# Patient Record
Sex: Female | Born: 1995 | Race: White | Hispanic: No | Marital: Single | Smoking: Never smoker
Health system: Southern US, Community
[De-identification: ages and names within clinical notes are randomized; demographics above are authoritative.]

## PROBLEM LIST (undated history)

## (undated) DIAGNOSIS — Z789 Other specified health status: Secondary | ICD-10-CM

## (undated) HISTORY — PX: NO PAST SURGERIES: SHX2092

---

## 2020-07-28 ENCOUNTER — Emergency Department (HOSPITAL_COMMUNITY)
Admission: EM | Admit: 2020-07-28 | Discharge: 2020-07-28 | Disposition: A | Discharge status: Home / Self Care | Payer: Self-pay | Attending: Emergency Medicine | Admitting: Emergency Medicine

## 2020-07-28 ENCOUNTER — Encounter (HOSPITAL_COMMUNITY): Payer: Self-pay | Admitting: *Deleted

## 2020-07-28 ENCOUNTER — Emergency Department (HOSPITAL_COMMUNITY): Payer: Self-pay

## 2020-07-28 DIAGNOSIS — R110 Nausea: Secondary | ICD-10-CM | POA: Insufficient documentation

## 2020-07-28 DIAGNOSIS — R1114 Bilious vomiting: Secondary | ICD-10-CM | POA: Insufficient documentation

## 2020-07-28 DIAGNOSIS — Z28310 Unvaccinated for covid-19: Secondary | ICD-10-CM | POA: Insufficient documentation

## 2020-07-28 DIAGNOSIS — R1084 Generalized abdominal pain: Secondary | ICD-10-CM | POA: Insufficient documentation

## 2020-07-28 LAB — COMPREHENSIVE METABOLIC PANEL
ALT: 9 U/L (ref 0–44)
AST: 10 U/L — ABNORMAL LOW (ref 15–41)
Albumin: 4.2 g/dL (ref 3.5–5.0)
Alkaline Phosphatase: 48 U/L (ref 38–126)
Anion gap: 5 (ref 5–15)
BUN: 9 mg/dL (ref 6–20)
CO2: 28 mmol/L (ref 22–32)
Calcium: 9.1 mg/dL (ref 8.9–10.3)
Chloride: 103 mmol/L (ref 98–111)
Creatinine, Ser: 0.86 mg/dL (ref 0.44–1.00)
GFR, Estimated: >60 mL/min (ref >60)
Glucose, Bld: 107 mg/dL — ABNORMAL HIGH (ref 70–99)
Potassium: 3.9 mmol/L (ref 3.5–5.1)
Sodium: 136 mmol/L (ref 135–145)
Total Bilirubin: 0.6 mg/dL (ref 0.3–1.2)
Total Protein: 7.5 g/dL (ref 6.5–8.1)

## 2020-07-28 LAB — URINALYSIS, ROUTINE W REFLEX MICROSCOPIC
Bilirubin Urine: NEGATIVE
Glucose, UA: NEGATIVE mg/dL
Ketones, ur: 20 mg/dL — AB
Leukocytes,Ua: NEGATIVE
Nitrite: NEGATIVE
Protein, ur: NEGATIVE mg/dL
Specific Gravity, Urine: 1.01 (ref 1.005–1.030)
pH: 7 (ref 5.0–8.0)

## 2020-07-28 LAB — CBC
HCT: 39.9 % (ref 36.0–46.0)
Hemoglobin: 12.8 g/dL (ref 12.0–15.0)
MCH: 27.8 pg (ref 26.0–34.0)
MCHC: 32.1 g/dL (ref 30.0–36.0)
MCV: 86.6 fL (ref 80.0–100.0)
Platelets: 364 10*3/uL (ref 150–400)
RBC: 4.61 MIL/uL (ref 3.87–5.11)
RDW: 12.7 % (ref 11.5–15.5)
WBC: 11.1 10*3/uL — ABNORMAL HIGH (ref 4.0–10.5)
nRBC: 0 % (ref 0.0–0.2)

## 2020-07-28 LAB — LIPASE, BLOOD: Lipase: 27 U/L (ref 11–51)

## 2020-07-28 LAB — I-STAT BETA HCG BLOOD, ED (MC, WL, AP ONLY): I-stat hCG, quantitative: <5 m[IU]/mL (ref <5)

## 2020-07-28 MED ORDER — MORPHINE SULFATE (PF) 4 MG/ML IV SOLN
4.0000 mg | Freq: Once | INTRAVENOUS | Status: AC
Start: 2020-07-28 — End: 2020-07-28
  Administered 2020-07-28: 4 mg via INTRAVENOUS
  Filled 2020-07-28: qty 1

## 2020-07-28 MED ORDER — METOCLOPRAMIDE HCL 5 MG/ML IJ SOLN
10.0000 mg | Freq: Once | INTRAMUSCULAR | Status: AC
  Administered 2020-07-28: 10 mg via INTRAVENOUS
  Filled 2020-07-28: qty 2

## 2020-07-28 MED ORDER — ONDANSETRON HCL 4 MG/2ML IJ SOLN
4.0000 mg | Freq: Once | INTRAMUSCULAR | Status: AC
  Administered 2020-07-28: 4 mg via INTRAVENOUS
  Filled 2020-07-28: qty 2

## 2020-07-28 MED ORDER — OMEPRAZOLE 20 MG PO CPDR: 20.0000 mg | DELAYED_RELEASE_CAPSULE | Freq: Every day | ORAL | 0 refills | Status: DC

## 2020-07-28 MED ORDER — SODIUM CHLORIDE 0.9 % IV BOLUS
1000.0000 mL | Freq: Once | INTRAVENOUS | Status: AC
  Administered 2020-07-28: 1000 mL via INTRAVENOUS

## 2020-07-28 MED ORDER — SUCRALFATE 1 G PO TABS: 1.0000 g | ORAL_TABLET | Freq: Three times a day (TID) | ORAL | 0 refills | Status: DC

## 2020-07-28 NOTE — ED Provider Notes (Signed)
[facility]-EMERGENCY DEPT Provider Note   CSN: [id number] Arrival date & time: 07/28/20  1101     History Chief Complaint  Patient presents with  . Abdominal Pain    Jenny Gross is a 25 y.o. female.  HPI Patient presents with her mother who assists with the history. She presents with 6 days of nausea,, p.o. intolerance and abdominal pain.  No known sick contacts.  She has not received her COVID vaccines.  No clear precipitant, and since onset she has had diffuse abdominal pain with out any ability for take medication for relief.  No fever, no diarrhea. No history of abdominal surgery.  Last menstrual period 8 days ago.  She went to another hospital yesterday, was prescribed Zofran, had evaluation not including CT or ultrasound, and was discharged.  Patient has not yet obtained or used her medication.    History reviewed. No pertinent past medical history.  There are no problems to display for this patient.   History reviewed. No pertinent surgical history.   OB History   No obstetric history on file.     No family history on file.     Home Medications Prior to Admission medications   Not on File    Allergies    Patient has no allergy information on record.  Review of Systems   Review of Systems  Constitutional:       Per HPI, otherwise negative  HENT:       Per HPI, otherwise negative  Respiratory:       Per HPI, otherwise negative  Cardiovascular:       Per HPI, otherwise negative  Gastrointestinal: Positive for abdominal pain, nausea and vomiting.  Endocrine:       Negative aside from HPI  Genitourinary:       Neg aside from HPI   Musculoskeletal:       Per HPI, otherwise negative  Skin: Negative.   Neurological: Negative for syncope.    Physical Exam Updated Vital Signs BP 109/72 (BP Location: Left Arm)   Pulse (!) 59   Temp 98.4 F (36.9 C) (Oral)   Resp 16   LMP 07/20/2020   SpO2 100%   Physical  Exam Vitals and nursing note reviewed.  Constitutional:      General: She is not in acute distress.    Appearance: She is well-developed.  HENT:     Head: Normocephalic and atraumatic.  Eyes:     Conjunctiva/sclera: Conjunctivae normal.  Cardiovascular:     Rate and Rhythm: Normal rate and regular rhythm.  Pulmonary:     Effort: Pulmonary effort is normal. No respiratory distress.     Breath sounds: Normal breath sounds. No stridor.  Abdominal:     General: There is no distension.     Tenderness: There is generalized abdominal tenderness. There is guarding.  Skin:    General: Skin is warm and dry.  Neurological:     Mental Status: She is alert and oriented to person, place, and time.     Cranial Nerves: No cranial nerve deficit.     ED Results / Procedures / Treatments   Labs (all labs ordered are listed, but only abnormal results are displayed) Labs Reviewed  COMPREHENSIVE METABOLIC PANEL - Abnormal; Notable for the following components:      Result Value   Glucose, Bld 107 (*)    AST 10 (*)    All other components within normal limits  CBC - Abnormal; Notable  for the following components:   WBC 11.1 (*)    All other components within normal limits  URINALYSIS, ROUTINE W REFLEX MICROSCOPIC - Abnormal; Notable for the following components:   Hgb urine dipstick MODERATE (*)    Ketones, ur 20 (*)    Bacteria, UA RARE (*)    All other components within normal limits  SARS CORONAVIRUS 2 (TAT 6-24 HRS)  LIPASE, BLOOD  I-STAT BETA HCG BLOOD, ED ([facility], [facility], [facility] ONLY)    Radiology CT Abdomen Pelvis Wo Contrast  Result Date: 07/28/2020 CLINICAL DATA:  Abdominal pain and vomiting for 6 days. Mid to lower abdominal pain EXAM: CT ABDOMEN AND PELVIS WITHOUT CONTRAST TECHNIQUE: Multidetector CT imaging of the abdomen and pelvis was performed following the standard protocol without IV contrast. COMPARISON:  None. FINDINGS: Lower chest: Included lung bases are clear.  Heart size is  normal. Hepatobiliary: Unremarkable unenhanced appearance of the liver. No focal liver lesion identified. Gallbladder within normal limits. No hyperdense gallstone. No biliary dilatation. Pancreas: Unremarkable. No pancreatic ductal dilatation or surrounding inflammatory changes. Spleen: Normal in size without focal abnormality. Adrenals/Urinary Tract: Adrenal glands are unremarkable. Kidneys are normal, without renal calculi, focal lesion, or hydronephrosis. Bladder is unremarkable. Stomach/Bowel: Stomach is within normal limits. Appendix appears normal (series 4, image 57). No evidence of bowel wall thickening, distention, or inflammatory changes. Vascular/Lymphatic: No significant vascular findings are evident on noncontrast study. No enlarged abdominal or pelvic lymph nodes. Reproductive: Uterus and adnexal regions are within normal limits for age. Other: Small volume free fluid within the cul-de-sac. No organized abdominopelvic fluid collection. No pneumoperitoneum. No abdominal wall hernia. Musculoskeletal: Suspect sacralization of the L5 segment. No acute osseous abnormality. No suspicious bone lesions. No soft tissue edema or fluid collections. IMPRESSION: 1. No acute abdominopelvic findings.  Normal appendix. 2. Small volume free fluid within the cul-de-sac, which may be physiologic. Electronically Signed   By: Duanne Guess D.O.   On: 07/28/2020 14:56   US Abdomen Limited  Result Date: 07/28/2020 CLINICAL DATA:  Nausea, vomiting, abdominal pain EXAM: ULTRASOUND ABDOMEN LIMITED RIGHT UPPER QUADRANT COMPARISON:  CT abdomen and pelvis 07/28/2020 FINDINGS: Gallbladder: Normally distended without stones or wall thickening. No pericholecystic fluid or sonographic Murphy sign. Common bile duct: Diameter: 5 mm, normal Liver: Normal echogenicity without mass or nodularity. Portal vein is patent on color Doppler imaging with normal direction of blood flow towards the liver. Other: No RIGHT upper quadrant free  fluid. IMPRESSION: Normal exam. Electronically Signed   By: Ulyses Southward M.D.   On: 07/28/2020 16:57    Procedures Procedures   Medications Ordered in ED Medications  sodium chloride 0.9 % bolus 1,000 mL (0 mLs Intravenous Stopped 07/28/20 1536)  ondansetron (ZOFRAN) injection 4 mg (4 mg Intravenous Given 07/28/20 1405)  morphine 4 MG/ML injection 4 mg (4 mg Intravenous Given 07/28/20 1405)  sodium chloride 0.9 % bolus 1,000 mL (0 mLs Intravenous Stopped 07/28/20 1633)  metoCLOPramide (REGLAN) injection 10 mg (10 mg Intravenous Given 07/28/20 1545)    ED Course  I have reviewed the triage vital signs and the nursing notes.  Pertinent labs & imaging results that were available during my care of the patient were reviewed by me and considered in my medical decision making (see chart for details).   On repeat exam after fluids provided the patient continues to complain of pain.  CT scan reviewed, no evidence for acute abdominal processes. With additional consideration of hepatobiliary dysfunction, ultrasound ordered.   5:13 PM Patient  in no distress, sitting upright.  She has tolerated small amounts of liquids. She and I discussed findings ultrasound, CT which I reviewed.  Also confirmed her absence of concern for STD, she denies marijuana use, drug use. With labs that are unremarkable aside from trivial leukocytosis, reassuring ultrasound, CT scan, vital signs, patient is appropriate for ongoing monitoring, management as an outpatient.  Patient amenable to this.  Patient will start empiric therapy for gastroesophageal etiology, follow-up with our GI colleagues.  COVID test pending on discharge. MDM Rules/Calculators/A&P MDM Number of Diagnoses or Management Options Bilious vomiting with nausea: established, worsening Generalized abdominal pain: established, worsening   Amount and/or Complexity of Data Reviewed Clinical lab tests: ordered and reviewed Tests in the radiology section of  CPT: reviewed and ordered Tests in the medicine section of CPT: reviewed and ordered Decide to obtain previous medical records or to obtain history from someone other than the patient: yes Review and summarize past medical records: yes Independent visualization of images, tracings, or specimens: yes  Risk of Complications, Morbidity, and/or Mortality Presenting problems: high Diagnostic procedures: high Management options: high  Critical Care Total time providing critical care: < 30 minutes  Patient Progress Patient progress: stable  Final Clinical Impression(s) / ED Diagnoses Final diagnoses:  Generalized abdominal pain  Bilious vomiting with nausea    Rx / DC Orders ED Discharge Orders         Ordered    omeprazole (PRILOSEC) 20 MG capsule  Daily        07/28/20 1716    sucralfate (CARAFATE) 1 g tablet  3 times daily        07/28/20 1716           Gerhard Munch, MD 07/28/20 1717

## 2020-07-28 NOTE — ED Triage Notes (Signed)
Pt complains of abdominal pain and vomiting x 6 days. No diarrhea.

## 2020-07-28 NOTE — Discharge Instructions (Signed)
As discussed, your evaluation today has been largely reassuring.  But, it is important that you monitor your condition carefully, and do not hesitate to return to the ED if you develop new, or concerning changes in your condition. ? ?Otherwise, please follow-up with your physician for appropriate ongoing care. ? ?

## 2020-07-28 NOTE — ED Provider Notes (Signed)
Emergency Medicine Provider Triage Evaluation Note  Jenny Gross , a 25 y.o. female  was evaluated in triage.  Pt complains of Abdominal pain and vomiting for 6 days.  Reports middle and lower abdominal pain.  Went to [facility] last night but did not feel better with medications.  Reports persistent vomiting.  No changes to bowel movements.  Review of Systems  Positive: Abdominal pain, vomiting Negative: Diarrhea or constipation  Physical Exam  BP 114/70 (BP Location: Left Arm)   Pulse 78   Temp 98.4 F (36.9 C) (Oral)   Resp 16   LMP 07/20/2020   SpO2 99%  Gen:   Awake, no distress   Resp:  Normal effort  MSK:   Moves extremities without difficulty  Other:  Generalized tenderness  Medical Decision Making  Medically screening exam initiated at 12:52 PM.  Appropriate orders placed.  [facility] Sc[facility]lin was informed that the remainder of the evaluation will be completed by another provider, this initial triage assessment does not replace that evaluation, and the importance of remaining in the ED until their evaluation is complete.     Dietrich Pates, PA-C 07/28/20 1253    Gerhard Munch, MD 07/28/20 1346

## 2021-08-02 ENCOUNTER — Inpatient Hospital Stay (HOSPITAL_COMMUNITY)
Admission: AD | Admit: 2021-08-02 | Discharge: 2021-08-02 | Disposition: A | Discharge status: Home / Self Care | Payer: Medicaid Other | Attending: Obstetrics and Gynecology | Admitting: Obstetrics and Gynecology

## 2021-08-02 ENCOUNTER — Encounter (HOSPITAL_COMMUNITY): Payer: Self-pay | Admitting: Obstetrics and Gynecology

## 2021-08-02 ENCOUNTER — Other Ambulatory Visit: Payer: Self-pay

## 2021-08-02 DIAGNOSIS — R109 Unspecified abdominal pain: Secondary | ICD-10-CM

## 2021-08-02 DIAGNOSIS — R519 Headache, unspecified: Secondary | ICD-10-CM | POA: Diagnosis not present

## 2021-08-02 DIAGNOSIS — O26892 Other specified pregnancy related conditions, second trimester: Secondary | ICD-10-CM

## 2021-08-02 DIAGNOSIS — Z3A23 23 weeks gestation of pregnancy: Secondary | ICD-10-CM

## 2021-08-02 HISTORY — DX: Other specified health status: Z78.9

## 2021-08-02 LAB — URINALYSIS, ROUTINE W REFLEX MICROSCOPIC
Bilirubin Urine: NEGATIVE
Glucose, UA: NEGATIVE mg/dL
Hgb urine dipstick: NEGATIVE
Ketones, ur: NEGATIVE mg/dL
Nitrite: NEGATIVE
Protein, ur: NEGATIVE mg/dL
Specific Gravity, Urine: 1.004 — ABNORMAL LOW (ref 1.005–1.030)
pH: 7 (ref 5.0–8.0)

## 2021-08-02 LAB — COMPREHENSIVE METABOLIC PANEL
ALT: 20 U/L (ref 0–44)
AST: 14 U/L — ABNORMAL LOW (ref 15–41)
Albumin: 3 g/dL — ABNORMAL LOW (ref 3.5–5.0)
Alkaline Phosphatase: 54 U/L (ref 38–126)
Anion gap: 10 (ref 5–15)
BUN: <5 mg/dL — ABNORMAL LOW (ref 6–20)
CO2: 24 mmol/L (ref 22–32)
Calcium: 8.8 mg/dL — ABNORMAL LOW (ref 8.9–10.3)
Chloride: 103 mmol/L (ref 98–111)
Creatinine, Ser: 0.49 mg/dL (ref 0.44–1.00)
GFR, Estimated: >60 mL/min (ref >60)
Glucose, Bld: 90 mg/dL (ref 70–99)
Potassium: 3.2 mmol/L — ABNORMAL LOW (ref 3.5–5.1)
Sodium: 137 mmol/L (ref 135–145)
Total Bilirubin: 0.5 mg/dL (ref 0.3–1.2)
Total Protein: 6 g/dL — ABNORMAL LOW (ref 6.5–8.1)

## 2021-08-02 LAB — CBC
HCT: 31 % — ABNORMAL LOW (ref 36.0–46.0)
Hemoglobin: 10.2 g/dL — ABNORMAL LOW (ref 12.0–15.0)
MCH: 29 pg (ref 26.0–34.0)
MCHC: 32.9 g/dL (ref 30.0–36.0)
MCV: 88.1 fL (ref 80.0–100.0)
Platelets: 262 10*3/uL (ref 150–400)
RBC: 3.52 MIL/uL — ABNORMAL LOW (ref 3.87–5.11)
RDW: 14 % (ref 11.5–15.5)
WBC: 12 10*3/uL — ABNORMAL HIGH (ref 4.0–10.5)
nRBC: 0 % (ref 0.0–0.2)

## 2021-08-02 MED ORDER — ACETAMINOPHEN-CAFFEINE 500-65 MG PO TABS
1.0000 | ORAL_TABLET | Freq: Once | ORAL | Status: AC
  Administered 2021-08-02: 1 via ORAL
  Filled 2021-08-02: qty 1

## 2021-08-02 MED ORDER — CYCLOBENZAPRINE HCL 5 MG PO TABS
10.0000 mg | ORAL_TABLET | Freq: Once | ORAL | Status: AC
  Administered 2021-08-02: 10 mg via ORAL
  Filled 2021-08-02: qty 2

## 2021-08-02 NOTE — Discharge Instructions (Signed)
Prenatal Care Providers           [facility]  [address] [phone]  [facility]   [address]  [phone]  [facility] @ [place]       [address] [phone]            [facility] @ [place]     [address] [phone]          [facility] @ [place]   [address] [phone]  [facility] @ [facility]  [address] [phone]     [facility] @ [facility])  [address]   [phone]      
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
## 2022-09-09 ENCOUNTER — Ambulatory Visit (INDEPENDENT_AMBULATORY_CARE_PROVIDER_SITE_OTHER): Payer: Medicaid Other | Admitting: Internal Medicine

## 2022-09-09 ENCOUNTER — Encounter: Payer: Self-pay | Admitting: Internal Medicine

## 2022-09-09 VITALS — BP 104/62 | HR 70 | Resp 16 | Ht 67.0 in | Wt 182.6 lb

## 2022-09-09 DIAGNOSIS — H1045 Other chronic allergic conjunctivitis: Secondary | ICD-10-CM | POA: Diagnosis not present

## 2022-09-09 DIAGNOSIS — L500 Allergic urticaria: Secondary | ICD-10-CM | POA: Diagnosis not present

## 2022-09-09 DIAGNOSIS — J3089 Other allergic rhinitis: Secondary | ICD-10-CM

## 2022-09-09 DIAGNOSIS — J302 Other seasonal allergic rhinitis: Secondary | ICD-10-CM | POA: Diagnosis not present

## 2022-09-09 MED ORDER — AZELASTINE HCL 0.1 % NA SOLN: 2.0000 | Freq: Two times a day (BID) | NASAL | 12 refills | Status: AC

## 2022-09-09 MED ORDER — OLOPATADINE HCL 0.2 % OP SOLN: 1.0000 [drp] | Freq: Every day | OPHTHALMIC | 5 refills | Status: AC | PRN

## 2022-09-09 MED ORDER — EPINEPHRINE 0.3 MG/0.3ML IJ SOAJ: 0.3000 mg | INTRAMUSCULAR | 1 refills | Status: AC | PRN

## 2022-09-09 MED ORDER — CETIRIZINE HCL 10 MG PO TABS: 10.0000 mg | ORAL_TABLET | Freq: Every day | ORAL | 5 refills | Status: AC

## 2022-09-09 MED ORDER — FLUTICASONE PROPIONATE 50 MCG/ACT NA SUSP: 2.0000 | Freq: Every day | NASAL | 2 refills | Status: AC

## 2022-09-09 NOTE — Progress Notes (Signed)
New Patient Note  RE: Jenny Gross MRN: [id number] DOB: December 18, 1995 Date of Office Visit: 09/09/2022  Consult requested by: No ref. provider found Primary care provider: Pcp, No  Chief Complaint: Allergies and Food Intolerance  History of Present Illness: I had the pleasure of seeing Andree Moro for initial evaluation at the [facility] on 09/09/2022. She is a 27 y.o. female, who is referred here by Pcp, No for the evaluation of rhinitis .  History obtained from patient, chart review.  Chronic rhinitis: started as a young child  Symptoms include: nasal congestion, rhinorrhea, post nasal drainage, sneezing, watery eyes, itchy eyes, and itchy nose  Occurs year-round Potential triggers: dogs, cats. Dust, chemicals, strong odors  Treatments tried: zyrtec (not benefit), unknown nose sprays with some benefits Previous allergy testing: no History of reflux/heartburn:  only during pregnancy  History of chronic sinusitis or sinus surgery: no    Assessment and Plan: [name] is a 27 y.o. female with: Seasonal and perennial allergic rhinitis  Other chronic allergic conjunctivitis of both eyes  Allergic urticaria   Plan: Patient Instructions  Chronic Rhinitis Seasonal and Perennial Allergic: - allergy testing today: positive to grass, weed, cat, dog, horse, dust mite   - Prevention:  - allergen avoidance when possible Start allergy injections. Had a detailed discussion with patient/family that clinical history is suggestive of allergic rhinitis, and may benefit from allergy immunotherapy (AIT). Discussed in detail regarding the dosing, schedule, side effects (mild to moderate local allergic reaction and rarely systemic allergic reactions including anaphylaxis/death), alternatives and benefits (significant improvement in nasal symptoms, seasonal flares of asthma) of immunotherapy with the patient. There is significant time commitment involved with allergy  shots, which includes weekly immunotherapy injections for first 9-12 months and then biweekly to monthly injections for 3-5 years. Clinical response is often delayed and patient may not see an improvement for 6-12 months. Consent was signed. I have prescribed epinephrine injectable and demonstrated proper use. For mild symptoms you can take over the counter antihistamines such as Benadryl and monitor symptoms closely. If symptoms worsen or if you have severe symptoms including breathing issues, throat closure, significant swelling, whole body hives, severe diarrhea and vomiting, lightheadedness then inject epinephrine and seek immediate medical care afterwards. Action plan given.   - Symptom control: - Start Nasal Steroid Spray: Best results if used daily. - Options include Flonase (fluticasone), Nasocort (triamcinolone), Nasonex (mometasome) 1- 2 sprays in each nostril daily.  - All can be purchased over-the-counter if not covered by insurance. - Start Astelin (azelastine) 1-2 sprays in each nostril twice a day as needed for nasal congestion/itchy nose - Start Singulair (Montelukast) 10mg  nightly.   - Discontinue if nightmares of behavior changes. - Start Antihistamine: daily or daily as needed.   -Options include Zyrtec (Cetirizine) 10mg , Claritin (Loratadine) 10mg , Allegra (Fexofenadine) 180mg , or Xyzal (Levocetirinze) 5mg  - Can be purchased over-the-counter if not covered by insurance.  Allergic Conjunctivitis:  - Start Allergy Eye drops-great options include Pataday (Olopatadine) or Zaditor (ketotifen) for eye symptoms daily as needed-both sold over the counter if not covered by insurance. and Rewetting Drops such as Systane,TheraTears, etc  -Avoid eye drops that say red eye relief as they may contain medications that dry out your eyes.  Follow up: in 4 weeks for initial AIT injection  Follow up: in clinic in 6 months   Thank you so much for letting me partake in your care today.  Don't  hesitate to reach out  if you have any additional concerns!  Ferol Luz, MD  [facility] [place] [place]  Control of Dog or Cat Allergen  Avoidance is the best way to manage a dog or cat allergy. If you have a dog or cat and are allergic to dog or cats, consider removing the dog or cat from the home. If you have a dog or cat but don't want to find it a new home, or if your family wants a pet even though someone in the household is allergic, here are some strategies that may help keep symptoms at bay:  Keep the pet out of your bedroom and restrict it to only a few rooms. Be advised that keeping the dog or cat in only one room will not limit the allergens to that room. Don't pet, hug or kiss the dog or cat; if you do, wash your hands with soap and water. High-efficiency particulate air (HEPA) cleaners run continuously in a bedroom or living room can reduce allergen levels over time. Regular use of a high-efficiency vacuum cleaner or a central vacuum can reduce allergen levels. Giving your dog or cat a bath at least once a week can reduce airborne allergen.  Reducing Pollen Exposure  The American Academy of Allergy, Asthma and Immunology suggests the following steps to reduce your exposure to pollen during allergy seasons.    Do not hang sheets or clothing out to dry; pollen may collect on these items. Do not mow lawns or spend time around freshly cut grass; mowing stirs up pollen. Keep windows closed at night.  Keep car windows closed while driving. Minimize morning activities outdoors, a time when pollen counts are usually at their highest. Stay indoors as much as possible when pollen counts or humidity is high and on windy days when pollen tends to remain in the air longer. Use air conditioning when possible.  Many air conditioners have filters that trap the pollen spores. Use a HEPA room air filter to remove pollen form the indoor air you breathe.  DUST MITE AVOIDANCE  MEASURES:  There are three main measures that need and can be taken to avoid house dust mites:  Reduce accumulation of dust in general -reduce furniture, clothing, carpeting, books, stuffed animals, especially in bedroom  Separate yourself from the dust -use pillow and mattress encasements (can be found at stores such as [employer], and [employer] or online) -avoid direct exposure to air condition flow -use a HEPA filter device, especially in the bedroom; you can also use a HEPA filter vacuum cleaner -wipe dust with a moist towel instead of a dry towel or broom when cleaning  Decrease mites and/or their secretions -wash clothing and linen and stuffed animals at highest temperature possible, at least every 2 weeks -stuffed animals can also be placed in a bag and put in a freezer overnight  Despite the above measures, it is impossible to eliminate dust mites or their allergen completely from your home.  With the above measures the burden of mites in your home can be diminished, with the goal of minimizing your allergic symptoms.  Success will be reached only when implementing and using all means together.     Meds ordered this encounter  Medications   cetirizine (ZYRTEC) 10 MG tablet    Sig: Take 1 tablet (10 mg total) by mouth daily.    Dispense:  30 tablet    Refill:  5   fluticasone (FLONASE) 50 MCG/ACT nasal spray  Sig: Place 2 sprays into both nostrils daily.    Dispense:  16 g    Refill:  2   azelastine (ASTELIN) 0.1 % nasal spray    Sig: Place 2 sprays into both nostrils 2 (two) times daily. Use in each nostril as directed    Dispense:  30 mL    Refill:  12   Olopatadine HCl (PATADAY) 0.2 % SOLN    Sig: Place 1 drop into both eyes daily as needed.    Dispense:  2.5 mL    Refill:  5    Run as RX NDC   EPINEPHrine (EPIPEN 2-PAK) 0.3 mg/0.3 mL IJ SOAJ injection    Sig: Inject 0.3 mg into the muscle as needed for anaphylaxis.    Dispense:  1 each    Refill:  1   Lab Orders   No laboratory test(s) ordered today    Other allergy screening: Asthma: no Rhino conjunctivitis: yes Food allergy: no Medication allergy: no Hymenoptera allergy:  when she was 27 years old she was stung by a bee and develop large local reaction  Urticaria:  contact urticaria with animal exposure  Eczema:no History of recurrent infections suggestive of immunodeficency: no  Diagnostics: Skin Testing: Environmental allergy panel and select foods.  adequate controls  Results interpreted by myself and discussed with patient/family.  Airborne Adult Perc - 09/09/22 1052     Time Antigen Placed 1052    Allergen Manufacturer Waynette Buttery    Location Back    Number of Test 55    Panel 1 Select    1. Control-Buffer 50% Glycerol Negative    2. Control-Histamine 4+    3. Bahia Negative    4. French Southern Territories Negative    5. Johnson Negative    6. [place] Blue 3+    7. Meadow Fescue 3+    8. Perennial Rye Negative    9. Timothy Negative    10. Ragweed Mix Negative    11. Cocklebur 2+    12. Plantain,  English Negative    13. Baccharis Negative    14. Dog Fennel Negative    15. Russian Thistle Negative    16. Lamb's Quarters Negative    17. Sheep Sorrell 2+    18. Rough Pigweed Negative    19. Marsh Elder, Rough Negative    20. Mugwort, Common Negative    21. Box, Elder Negative    22. Cedar, red Negative    23. Sweet Gum Negative    24. Pecan Pollen Negative    25. Pine Mix 2+    26. Walnut, Black Pollen Negative    27. Red Mulberry Negative    28. Ash Mix Negative    29. Birch Mix Negative    30. Beech American Negative    31. Cottonwood, Guinea-Bissau Negative    32. Hickory, White Negative    33. Maple Mix Negative    34. Oak, Guinea-Bissau Mix Negative    35. Sycamore Eastern Negative    36. Alternaria Alternata Negative    37. Cladosporium Herbarum Negative    38. Aspergillus Mix Negative    39. Penicillium Mix Negative    40. Bipolaris Sorokiniana (Helminthosporium) Negative    41.  Drechslera Spicifera (Curvularia) Negative    42. Mucor Plumbeus Negative    43. Fusarium Moniliforme Negative    44. Aureobasidium Pullulans (pullulara) Negative    45. Rhizopus Oryzae Negative    46. Botrytis Cinera Negative    47. Epicoccum Nigrum Negative  48. Phoma Betae Negative    49. Dust Mite Mix 4+    50. Cat Hair 10,000 BAU/ml 3+    51.  Dog Epithelia Negative    52. Mixed Feathers Negative    53. Horse Epithelia 3+    54. Cockroach, German Negative    55. Tobacco Leaf Negative             13 Food Perc - 09/09/22 1052       Test Information   Time Antigen Placed 1052    Allergen Manufacturer Waynette Buttery    Location Back    Number of allergen test 13    Food Select      Food   1. Peanut Negative    2. Soybean Negative    3. Wheat Negative    4. Sesame Negative    5. Milk, Cow Negative    6. Casein Negative    7. Egg White, Chicken Negative    8. Shellfish Mix Negative    9. Fish Mix Negative    10. Cashew Negative    11. Walnut Food Negative    12. Almond Negative    13. Hazelnut Negative             Intradermal - 09/09/22 1052     Time Antigen Placed 1052    Allergen Manufacturer Waynette Buttery    Location Arm    Number of Test 12    Intradermal Select    Control Negative    Bahia Negative    French Southern Territ[place] 3+    Johnson Negative    Ragweed Mix Negative    Tree Mix Negative    Mold 1 Negative    Mold 2 Negative    Mold 3 Negative    Mold 4 Negative    Dog 3+    Cockroach Negative             Past Medical History: There are no problems to display for this patient.  Past Medical History:  Diagnosis Date   Medical history non-contributory    Past Surgical History: Past Surgical History:  Procedure Laterality Date   NO PAST SURGERIES     Medication List:  Current Outpatient Medications  Medication Sig Dispense Refill   azelastine (ASTELIN) 0.1 % nasal spray Place 2 sprays into both nostrils 2 (two) times daily. Use in each nostril as  directed 30 mL 12   cetirizine (ZYRTEC) 10 MG tablet Take 1 tablet (10 mg total) by mouth daily. 30 tablet 5   EPINEPHrine (EPIPEN 2-PAK) 0.3 mg/0.3 mL IJ SOAJ injection Inject 0.3 mg into the muscle as needed for anaphylaxis. 1 each 1   fluticasone (FLONASE) 50 MCG/ACT nasal spray Place 2 sprays into both nostrils daily. 16 g 2   Olopatadine HCl (PATADAY) 0.2 % SOLN Place 1 drop into both eyes daily as needed. 2.5 mL 5   No current facility-administered medications for this visit.   Allergies: No Known Allergies Social History: Social History   Socioeconomic History   Marital status: Single    Spouse name: Not on file   Number of children: Not on file   Years of education: Not on file   Highest education level: Not on file  Occupational History   Not on file  Tobacco Use   Smoking status: Never   Smokeless tobacco: Never  Vaping Use   Vaping status: Never Used  Substance and Sexual Activity   Alcohol use: Never   Drug use: Never  Sexual activity: Yes    Comment: 07/31/2021  Other Topics Concern   Not on file  Social History Narrative   Not on file   Social Determinants of Health   Financial Resource Strain: Not on file  Food Insecurity: Not on file  Transportation Needs: Not on file  Physical Activity: Not on file  Stress: Not on file  Social Connections: Not on file   Lives in a [facility], no roaches in the house and bed is 2 feet off the floor, no dust mite precautions, no roaches in the house and bed is 2 ft off the floor, she is exposed to fumes, chemicals, dust through her job. No hepa filter int he home and home is not near an interstate or industrial area . Smoking: no exposure  Occupation: Research scientist (medical) HistorySurveyor, minerals in the house: yes Carpet in the family room: yes Carpet in the bedroom: yes Heating: electric Cooling: window Pet: yes cat and 2 lizards, cat has access to bedroom   Family History: History reviewed. No pertinent  family history.   ROS: All others negative except as noted per HPI.   Objective: BP 104/62   Pulse 70   Resp 16   Ht 5\' 7"  (1.702 m)   Wt 182 lb 9.6 oz (82.8 kg)   SpO2 98%   BMI 28.60 kg/m  Body mass index is 28.6 kg/m.  General Appearance:  Alert, cooperative, no distress, appears stated age  Head:  Normocephalic, without obvious abnormality, atraumatic  Eyes:  Conjunctiva clear, EOM's intact  Nose: Nares normal,  erythematous and edematous nasal mucosa with copious clear rhinorrhea, hypertrophic turbinates, no visible anterior polyps, and septum midline  Throat: Lips, tongue normal; teeth and gums normal, + cobblestoning  Neck: Supple, symmetrical  Lungs:   clear to auscultation bilaterally, Respirations unlabored, no coughing  Heart:  regular rate and rhythm and no murmur, Appears well perfused  Extremities: No edema  Skin: Skin color, texture, turgor normal, no rashes or lesions on visualized portions of skin  Neurologic: No gross deficits   The plan was reviewed with the patient/family, and all questions/concerned were addressed.  It was my pleasure to see Anastasya today and participate in her care. Please feel free to contact me with any questions or concerns.  Sincerely,  Ferol Luz, MD Allergy & Immunology  [facility] office: [phone] [facility] office: [phone]

## 2022-09-09 NOTE — Patient Instructions (Addendum)
Chronic Rhinitis Seasonal and Perennial Allergic: - allergy testing today: positive to grass, weed, cat, dog, horse, dust mite   - Prevention:  - allergen avoidance when possible Start allergy injections. Had a detailed discussion with patient/family that clinical history is suggestive of allergic rhinitis, and may benefit from allergy immunotherapy (AIT). Discussed in detail regarding the dosing, schedule, side effects (mild to moderate local allergic reaction and rarely systemic allergic reactions including anaphylaxis/death), alternatives and benefits (significant improvement in nasal symptoms, seasonal flares of asthma) of immunotherapy with the patient. There is significant time commitment involved with allergy shots, which includes weekly immunotherapy injections for first 9-12 months and then biweekly to monthly injections for 3-5 years. Clinical response is often delayed and patient may not see an improvement for 6-12 months. Consent was signed. I have prescribed epinephrine injectable and demonstrated proper use. For mild symptoms you can take over the counter antihistamines such as Benadryl and monitor symptoms closely. If symptoms worsen or if you have severe symptoms including breathing issues, throat closure, significant swelling, whole body hives, severe diarrhea and vomiting, lightheadedness then inject epinephrine and seek immediate medical care afterwards. Action plan given.   - Symptom control: - Start Nasal Steroid Spray: Best results if used daily. - Options include Flonase (fluticasone), Nasocort (triamcinolone), Nasonex (mometasome) 1- 2 sprays in each nostril daily.  - All can be purchased over-the-counter if not covered by insurance. - Start Astelin (azelastine) 1-2 sprays in each nostril twice a day as needed for nasal congestion/itchy nose - Start Singulair (Montelukast) 10mg  nightly.   - Discontinue if nightmares of behavior changes. - Start Antihistamine: daily or daily  as needed.   -Options include Zyrtec (Cetirizine) 10mg , Claritin (Loratadine) 10mg , Allegra (Fexofenadine) 180mg , or Xyzal (Levocetirinze) 5mg  - Can be purchased over-the-counter if not covered by insurance.  Allergic Conjunctivitis:  - Start Allergy Eye drops-great options include Pataday (Olopatadine) or Zaditor (ketotifen) for eye symptoms daily as needed-both sold over the counter if not covered by insurance. and Rewetting Drops such as Systane,TheraTears, etc  -Avoid eye drops that say red eye relief as they may contain medications that dry out your eyes.  Follow up: in 4 weeks for initial AIT injection  Follow up: in clinic in 6 months   Thank you so much for letting me partake in your care today.  Don't hesitate to reach out if you have any additional concerns!  Ferol Luz, MD  [facility] [place]  Control of Dog or Cat Allergen  Avoidance is the best way to manage a dog or cat allergy. If you have a dog or cat and are allergic to dog or cats, consider removing the dog or cat from the home. If you have a dog or cat but don't want to find it a new home, or if your family wants a pet even though someone in the household is allergic, here are some strategies that may help keep symptoms at bay:  Keep the pet out of your bedroom and restrict it to only a few rooms. Be advised that keeping the dog or cat in only one room will not limit the allergens to that room. Don't pet, hug or kiss the dog or cat; if you do, wash your hands with soap and water. High-efficiency particulate air (HEPA) cleaners run continuously in a bedroom or living room can reduce allergen levels over time. Regular use of a high-efficiency vacuum cleaner or a central vacuum can reduce allergen levels. Giving your  dog or cat a bath at least once a week can reduce airborne allergen.  Reducing Pollen Exposure  The American Academy of Allergy, Asthma and Immunology suggests the following steps  to reduce your exposure to pollen during allergy seasons.    Do not hang sheets or clothing out to dry; pollen may collect on these items. Do not mow lawns or spend time around freshly cut grass; mowing stirs up pollen. Keep windows closed at night.  Keep car windows closed while driving. Minimize morning activities outdoors, a time when pollen counts are usually at their highest. Stay indoors as much as possible when pollen counts or humidity is high and on windy days when pollen tends to remain in the air longer. Use air conditioning when possible.  Many air conditioners have filters that trap the pollen spores. Use a HEPA room air filter to remove pollen form the indoor air you breathe.  DUST MITE AVOIDANCE MEASURES:  There are three main measures that need and can be taken to avoid house dust mites:  Reduce accumulation of dust in general -reduce furniture, clothing, carpeting, books, stuffed animals, especially in bedroom  Separate yourself from the dust -use pillow and mattress encasements (can be found at stores such as Bed, Bath, and Beyond or online) -avoid direct exposure to air condition flow -use a HEPA filter device, especially in the bedroom; you can also use a HEPA filter vacuum cleaner -wipe dust with a moist towel instead of a dry towel or broom when cleaning  Decrease mites and/or their secretions -wash clothing and linen and stuffed animals at highest temperature possible, at least every 2 weeks -stuffed animals can also be placed in a bag and put in a freezer overnight  Despite the above measures, it is impossible to eliminate dust mites or their allergen completely from your home.  With the above measures the burden of mites in your home can be diminished, with the goal of minimizing your allergic symptoms.  Success will be reached only when implementing and using all means together.

## 2022-09-19 DIAGNOSIS — J3081 Allergic rhinitis due to animal (cat) (dog) hair and dander: Secondary | ICD-10-CM | POA: Diagnosis not present

## 2022-09-19 NOTE — Progress Notes (Signed)
Aeroallergen Immunotherapy  Ordering Provider: Dr. Ferol Luz  Patient Details Name: Jenny Gross MRN: [id number] Date of Birth: Jun 28, 1995  Order 1 of 1  Vial Label: G-W-T-Dm-C-D  0.3 ml (Volume)  BAU Concentration -- 7 Grass Mix* 100,000 ([address] [place], [place], [place], [place] Rye, RedTop, Sweet Vernal, Timothy) 0.3 ml (Volume)  BAU Concentration -- French Southern Territories 10,000 0.2 ml (Volume)  1:20 Concentration -- Cocklebur 0.2 ml (Volume)  1:10 Concentration -- Sheep Sorrell* 0.2 ml (Volume)  1:10 Concentration -- Pine Mix 0.5 ml (Volume)  1:10 Concentration -- Cat Hair 0.5 ml (Volume)  1:10 Concentration -- Dog Epithelia 0.5 ml (Volume)   AU Concentration -- Mite Mix (DF 5,000 & DP 5,000)   2.7  ml Extract Subtotal 2.3  ml Diluent 5.0  ml Maintenance Total  Schedule:    Blue Vial (1:100,000): Schedule B (6 doses) Yellow Vial (1:10,000): Schedule B (6 doses) Green Vial (1:1,000): Schedule B (6 doses) Red Vial (1:100): Schedule A (10 doses)  Special Instructions: none

## 2022-09-19 NOTE — Progress Notes (Signed)
VIALS EXP 09-19-23

## 2022-10-03 ENCOUNTER — Ambulatory Visit (INDEPENDENT_AMBULATORY_CARE_PROVIDER_SITE_OTHER): Payer: Medicaid Other | Admitting: *Deleted

## 2022-10-03 DIAGNOSIS — J309 Allergic rhinitis, unspecified: Secondary | ICD-10-CM

## 2022-10-03 NOTE — Progress Notes (Signed)
Immunotherapy   Patient Details  Name: Rei Drummonds MRN: [id number] Date of Birth: 11/26/1995  10/03/2022  Gwynneth Albright started injections for  Blue 1:100,000 (G-W-T-DM-C-D) Following schedule: B  Frequency:2 times per week Epi-Pen:Epi-Pen Available  Consent signed and patient instructions given. No issues after wait time.    Redge Gainer 10/03/2022, 10:54 AM

## 2022-11-30 ENCOUNTER — Ambulatory Visit (INDEPENDENT_AMBULATORY_CARE_PROVIDER_SITE_OTHER): Payer: Self-pay

## 2022-11-30 DIAGNOSIS — J309 Allergic rhinitis, unspecified: Secondary | ICD-10-CM | POA: Diagnosis not present

## 2022-12-05 ENCOUNTER — Ambulatory Visit (INDEPENDENT_AMBULATORY_CARE_PROVIDER_SITE_OTHER): Payer: Medicaid Other

## 2022-12-05 DIAGNOSIS — J309 Allergic rhinitis, unspecified: Secondary | ICD-10-CM | POA: Diagnosis not present

## 2023-01-23 ENCOUNTER — Ambulatory Visit (INDEPENDENT_AMBULATORY_CARE_PROVIDER_SITE_OTHER): Payer: Medicaid Other | Admitting: *Deleted

## 2023-01-23 DIAGNOSIS — J309 Allergic rhinitis, unspecified: Secondary | ICD-10-CM | POA: Diagnosis not present

## 2023-05-30 IMAGING — US US ABDOMEN LIMITED
1 series · 15 of 25 positions shown · non-contrast
Comparison: CT abdomen and pelvis 07/28/2020

CLINICAL DATA: Nausea, vomiting, abdominal pain

EXAM:
ULTRASOUND ABDOMEN LIMITED RIGHT UPPER QUADRANT

[Series 1: us abdomen limited mc & wl · 15 of 47 slices shown]
[im 1/47]
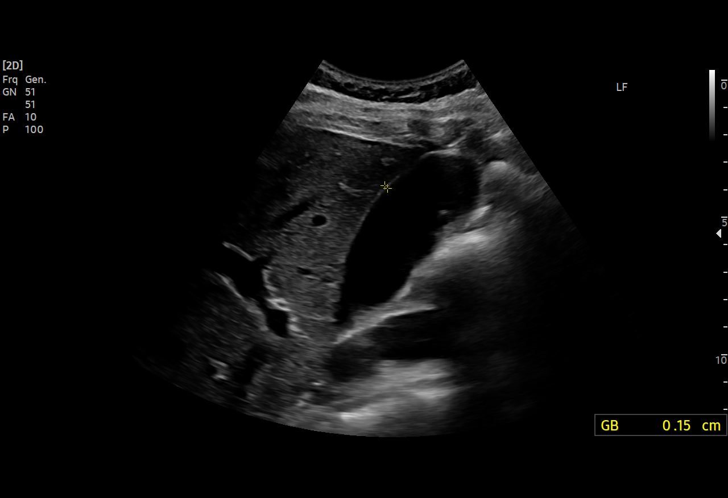
[im 4/47]
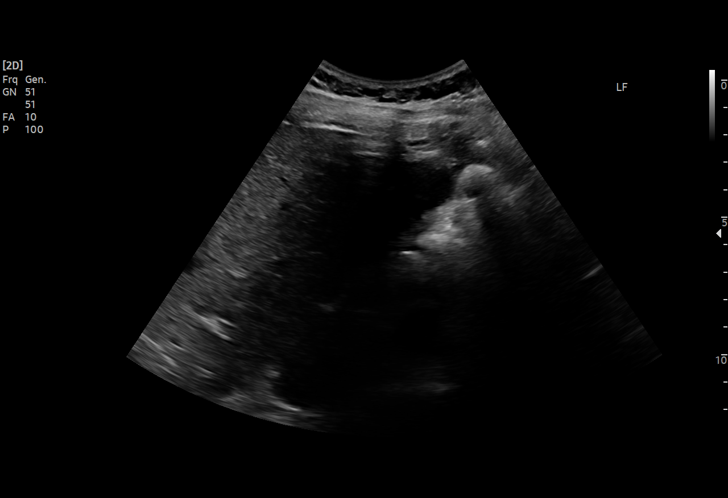
[im 8/47]
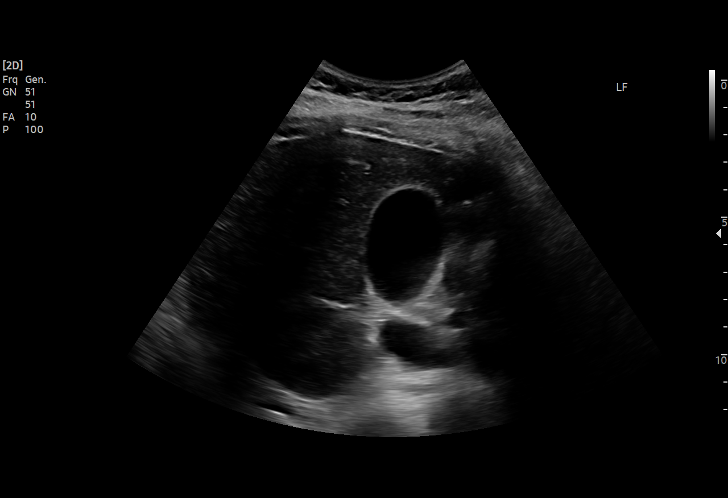
[im 10/47]
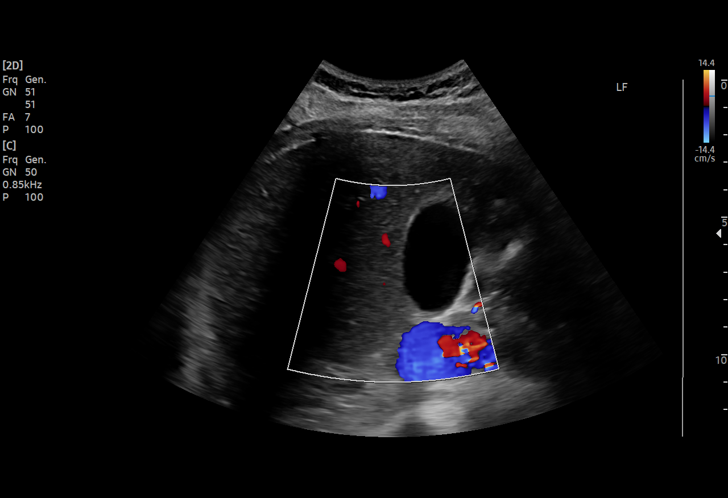
[im 14/47]
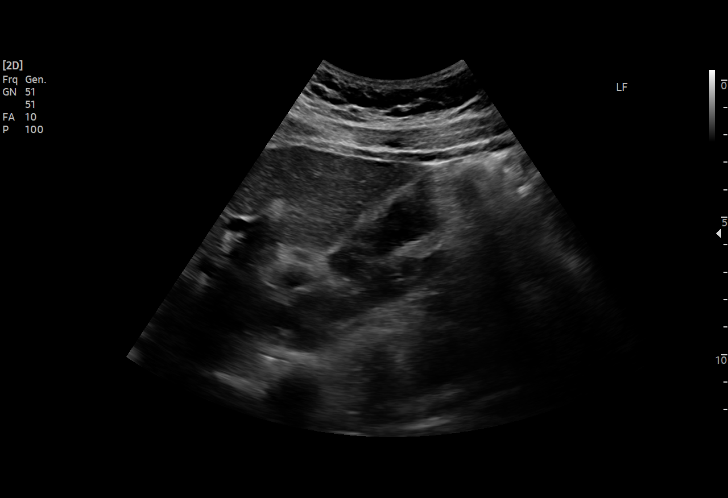
[im 18/47]
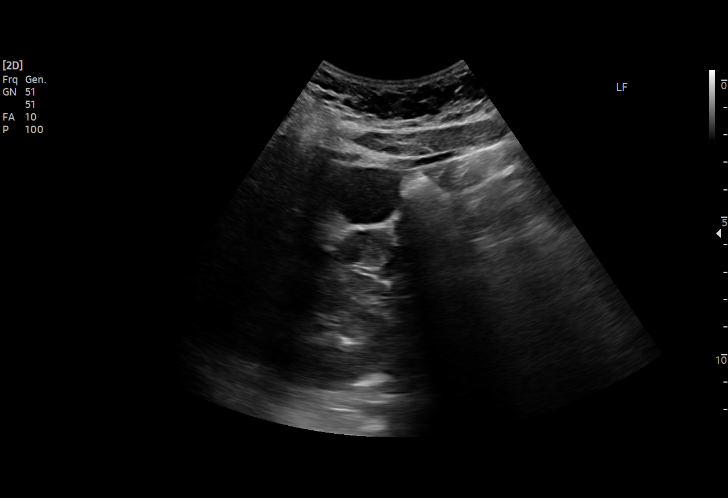
[im 20/47]
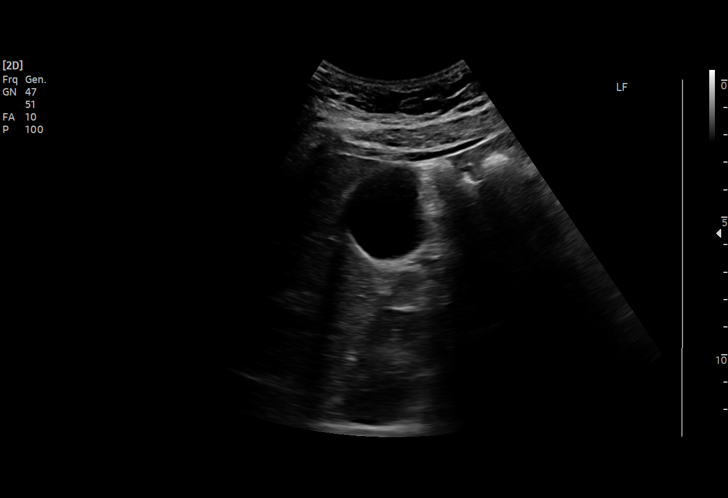
[im 24/47]
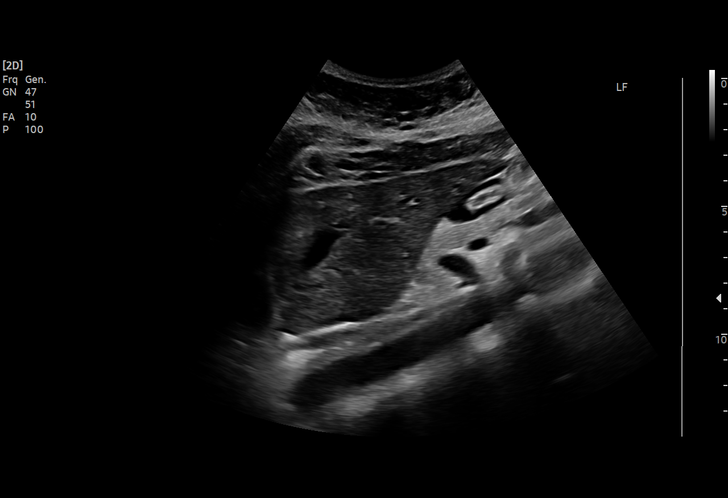
[im 27/47]
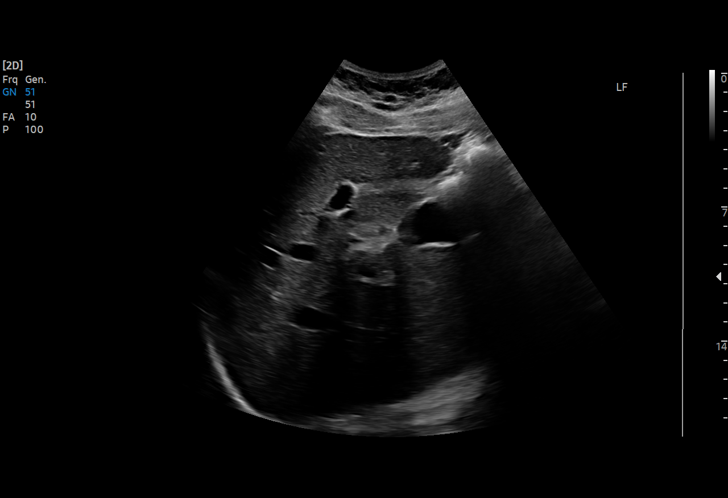
[im 29/47]
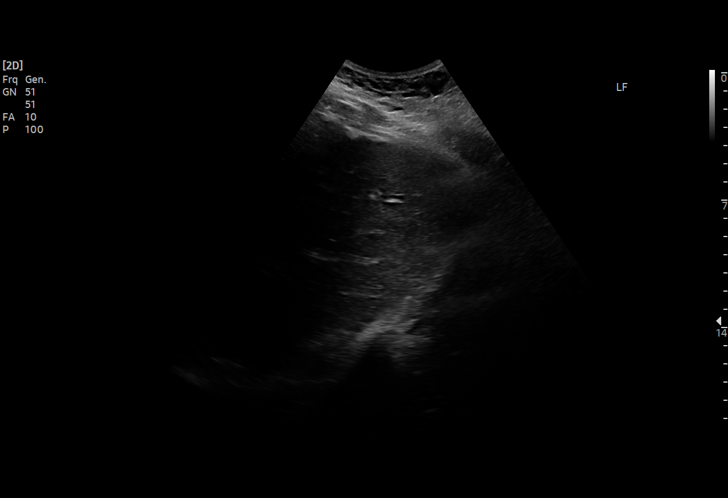
[im 33/47]
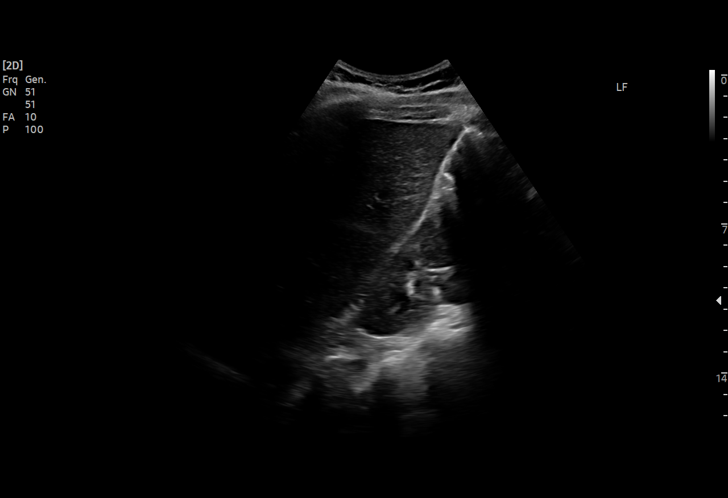
[im 37/47]
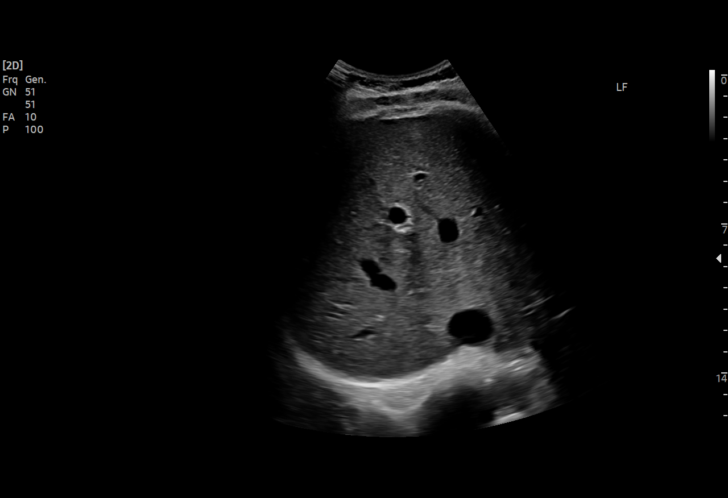
[im 39/47]
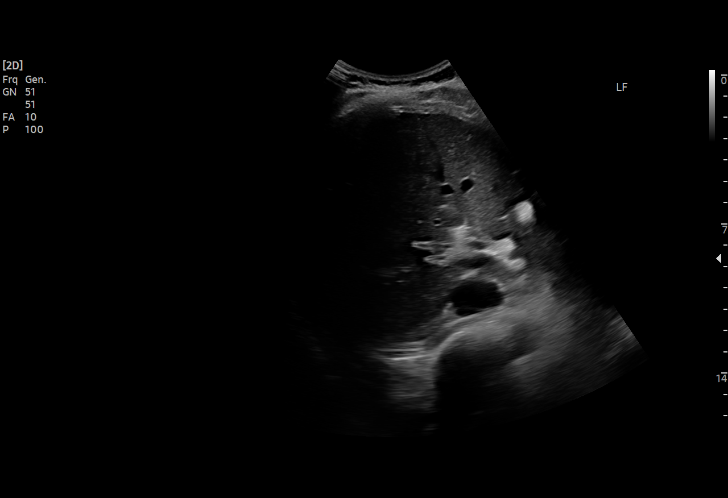
[im 43/47]
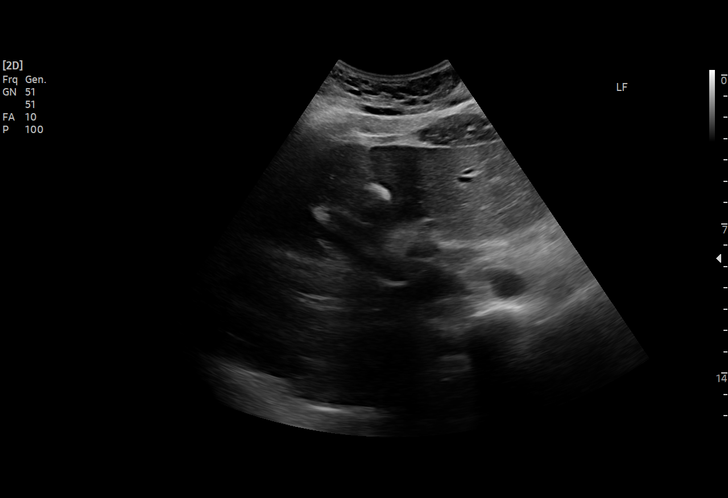
[im 47/47]
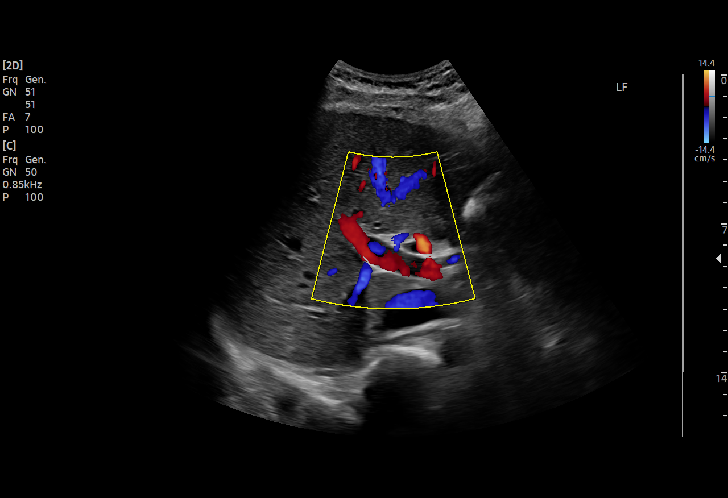

[15 of 25 positions shown; findings below may reference images not displayed]

FINDINGS: Gallbladder:

Normally distended without stones or wall thickening. No
pericholecystic fluid or sonographic Murphy sign.

Common bile duct:

Diameter: 5 mm, normal

Liver:

Normal echogenicity without mass or nodularity. Portal vein is
patent on color Doppler imaging with normal direction of blood flow
towards the liver.

Other: No RIGHT upper quadrant free fluid.
IMPRESSION: Normal exam.
